# Patient Record
Sex: Male | Born: 1960 | Race: White | Hispanic: No | Marital: Married | Smoking: Former smoker
Health system: Southern US, Community
[De-identification: ages and names within clinical notes are randomized; demographics above are authoritative.]

## PROBLEM LIST (undated history)

## (undated) DIAGNOSIS — M1990 Unspecified osteoarthritis, unspecified site: Secondary | ICD-10-CM

## (undated) DIAGNOSIS — K219 Gastro-esophageal reflux disease without esophagitis: Secondary | ICD-10-CM

## (undated) DIAGNOSIS — C61 Malignant neoplasm of prostate: Secondary | ICD-10-CM

## (undated) DIAGNOSIS — I10 Essential (primary) hypertension: Secondary | ICD-10-CM

## (undated) HISTORY — PX: PROSTATE BIOPSY: SHX241

---

## 1998-04-10 ENCOUNTER — Inpatient Hospital Stay (HOSPITAL_COMMUNITY): Admission: EM | Admit: 1998-04-10 | Discharge: 1998-04-19 | Payer: Self-pay | Admitting: *Deleted

## 1998-04-10 ENCOUNTER — Encounter: Payer: Self-pay | Admitting: *Deleted

## 1998-04-10 ENCOUNTER — Encounter: Payer: Self-pay | Admitting: Specialist

## 1998-04-11 ENCOUNTER — Encounter: Payer: Self-pay | Admitting: Specialist

## 1998-04-12 ENCOUNTER — Encounter: Payer: Self-pay | Admitting: Specialist

## 1998-04-13 ENCOUNTER — Encounter: Payer: Self-pay | Admitting: Specialist

## 1998-04-18 ENCOUNTER — Encounter: Payer: Self-pay | Admitting: Specialist

## 1998-04-19 ENCOUNTER — Inpatient Hospital Stay (HOSPITAL_COMMUNITY)
Admission: RE | Admit: 1998-04-19 | Discharge: 1998-04-27 | Payer: Self-pay | Admitting: Physical Medicine & Rehabilitation

## 1998-04-25 ENCOUNTER — Encounter: Payer: Self-pay | Admitting: Physical Medicine & Rehabilitation

## 2004-04-20 ENCOUNTER — Emergency Department (HOSPITAL_COMMUNITY): Admission: EM | Admit: 2004-04-20 | Discharge: 2004-04-20 | Payer: Self-pay | Admitting: Family Medicine

## 2016-12-13 ENCOUNTER — Ambulatory Visit
Admission: RE | Admit: 2016-12-13 | Discharge: 2016-12-13 | Disposition: A | Discharge status: Home / Self Care | Payer: No Typology Code available for payment source | Source: Ambulatory Visit | Attending: Occupational Medicine | Admitting: Occupational Medicine

## 2016-12-13 ENCOUNTER — Other Ambulatory Visit: Payer: Self-pay | Admitting: Occupational Medicine

## 2016-12-13 DIAGNOSIS — Z021 Encounter for pre-employment examination: Secondary | ICD-10-CM

## 2017-04-01 DIAGNOSIS — I10 Essential (primary) hypertension: Secondary | ICD-10-CM | POA: Diagnosis not present

## 2017-05-10 DIAGNOSIS — I10 Essential (primary) hypertension: Secondary | ICD-10-CM | POA: Diagnosis not present

## 2017-10-10 DIAGNOSIS — Z23 Encounter for immunization: Secondary | ICD-10-CM | POA: Diagnosis not present

## 2017-10-21 DIAGNOSIS — M67372 Transient synovitis, left ankle and foot: Secondary | ICD-10-CM | POA: Diagnosis not present

## 2017-10-21 DIAGNOSIS — M19072 Primary osteoarthritis, left ankle and foot: Secondary | ICD-10-CM | POA: Diagnosis not present

## 2017-10-31 DIAGNOSIS — M67372 Transient synovitis, left ankle and foot: Secondary | ICD-10-CM | POA: Diagnosis not present

## 2017-11-12 DIAGNOSIS — I10 Essential (primary) hypertension: Secondary | ICD-10-CM | POA: Diagnosis not present

## 2019-01-30 DIAGNOSIS — I7090 Unspecified atherosclerosis: Secondary | ICD-10-CM

## 2019-01-30 NOTE — ED Notes (Signed)
cpr continued dr Emily Filbert at the bedside

## 2019-01-30 NOTE — ED Notes (Signed)
Dr Emily Filbert has ended the code time of death

## 2019-01-31 ENCOUNTER — Inpatient Hospital Stay
Admit: 2019-01-31 | Discharge: 2019-02-02 | Disposition: E | Payer: PRIVATE HEALTH INSURANCE | Attending: Emergency Medicine

## 2019-01-31 MED ORDER — EPINEPHRINE 0.1 MG/ML SYRINGE
0.1 mg/mL | INTRAMUSCULAR | Status: AC | PRN
Start: 2019-01-31 — End: 2019-01-30
  Administered 2019-01-31: 05:00:00 via INTRAVENOUS

## 2019-01-31 NOTE — ED Notes (Signed)
I just spoke with the patient's daughter named Darrien Belter.  The [facility] police Department had stop by moments ago at their home and notified them of the death of Allen Lee.  The family was told of the cardiac arrest and the attempts to resuscitate [name].  They understand the medical examiner has refused the case and they have been encouraged to call their local funeral home of choice later this morning who can arrange for the transfer the [place] back [place] .

## 2019-01-31 NOTE — ED Provider Notes (Addendum)
This 59 year old white male presents via ambulance with CPR in progress.  Apparently this patient screamed out and was then found unresponsive by his roommate who called EMS.  About 15 minutes later they arrived finding the patient in asystole.  The patient had an LMA placed and CPR was started.  He received 4 or 5 rounds of IV epinephrine.  He never had return of spontaneous circulation.  Upon arriving here in the emergency room about 25 minutes later the patient was still in asystole.  The patient has no prior records here at this hospital or in Care everywhere.  EMS did not get any medical history from the room a prior to bringing the patient to the emergency room.  No vomiting had been noted.           History reviewed. No pertinent past medical history.    No past surgical history on file.      History reviewed. No pertinent family history.    Social History     Socioeconomic History   ??? Marital status: Not on file     Spouse name: Not on file   ??? Number of children: Not on file   ??? Years of education: Not on file   ??? Highest education level: Not on file   Occupational History   ??? Not on file   Social Needs   ??? Financial resource strain: Not on file   ??? Food insecurity     Worry: Not on file     Inability: Not on file   ??? Transportation needs     Medical: Not on file     Non-medical: Not on file   Tobacco Use   ??? Smoking status: Not on file   Substance and Sexual Activity   ??? Alcohol use: Not on file   ??? Drug use: Not on file   ??? Sexual activity: Not on file   Lifestyle   ??? Physical activity     Days per week: Not on file     Minutes per session: Not on file   ??? Stress: Not on file   Relationships   ??? Social Wellsite geologist on phone: Not on file     Gets together: Not on file     Attends religious service: Not on file     Active member of club or organization: Not on file     Attends meetings of clubs or organizations: Not on file     Relationship status: Not on file   ??? Intimate partner violence      Fear of current or ex partner: Not on file     Emotionally abused: Not on file     Physically abused: Not on file     Forced sexual activity: Not on file   Other Topics Concern   ??? Not on file   Social History Narrative   ??? Not on file         ALLERGIES: Patient has no known allergies.    Review of Systems   Unable to perform ROS: Intubated       Vitals:    February 01, 2019 2345   BP: (!) 0/0   Pulse: (!) 0   Resp: (!) 0   SpO2: (!) 56%            Physical Exam  Nursing note reviewed.   Constitutional:       Appearance: He is obese.      Comments:  This patient was intubated with bilateral breath sounds noted.  He was connected to the [place] 2 the which was provided chest compressions.  The patient had cool extremities x4.  His pupils were both fixed and dilated.  No spontaneous respirations or palpable/auscultated heart sounds were noted.  His legs or thin without any edema/swelling.   HENT:      Head: Normocephalic.      Nose: No rhinorrhea.   Eyes:      Conjunctiva/sclera: Conjunctivae normal.      Comments: The pupils were fixed and dilated.   Cardiovascular:      Comments: No pulses were noted when CPR was paused.  Pulmonary:      Comments: The patient had clear breath sounds on the left and a few rales in the right side.  Abdominal:      Palpations: Abdomen is soft.   Musculoskeletal:      Right lower leg: No edema.      Left lower leg: No edema.   Skin:     General: Skin is dry.      Findings: No rash.      Comments: No rash was noted.  The skin was cool and pale.  A 5 cm wide old scar was noted to the infraumbilical to suprapubic area.          MDM  Number of Diagnoses or Management Options  Asystole ([place])  Atherosclerotic vascular disease  Obesity (BMI 30-39.9)  Sudden cardiac death [facility])   Diagnosis management comments: This 59 year old white male collapsed about 40 minutes prior to arrival to the emergency room and was noted to be in asystole about 15 minutes later upon EMS arrival.  The patient received IV epi and CPR prior to arrival.  He remained in asystole.  He had 5 minutes of CPR with another dose of epinephrine here in the emergency room with CPR without benefit.  The code was called at 11:50 p.m..  The case was discussed with the ENT specialist Johns no who has declined the case.  I will complete the [place] of death does the the certifying and pronouncing physician.         Procedures      NIH Stroke Scale                   ICD-10-CM ICD-9-CM    1. Atherosclerotic vascular disease  I70.90 440.9    2. Sudden cardiac death ([place])  I46.9 798.1    3. Obesity (BMI 30-39.9)  E66.9 278.00    4. Asystole (HCC)  I46.9 427.5

## 2019-01-31 NOTE — ED Triage Notes (Signed)
The patient was found unconscious/unresponsive in the basement, ems found him asystole, cpr was initiated, placed an lma, io and gave 5 epinepherines with conversion to pea, transported him here

## 2019-01-31 NOTE — ED Notes (Signed)
The patient was placed in a body bag, he has his personal belongings including wallet, cash, t-shirt, gym pants and underware

## 2019-01-31 NOTE — ED Notes (Signed)
Allen Lee ME on the phone with Dr. Emily Filbert

## 2019-02-02 DEATH — deceased

## 2020-02-18 ENCOUNTER — Encounter: Payer: Self-pay | Admitting: Radiation Oncology

## 2020-02-18 DIAGNOSIS — R030 Elevated blood-pressure reading, without diagnosis of hypertension: Secondary | ICD-10-CM | POA: Insufficient documentation

## 2020-02-18 DIAGNOSIS — S91319A Laceration without foreign body, unspecified foot, initial encounter: Secondary | ICD-10-CM | POA: Insufficient documentation

## 2020-02-18 NOTE — Progress Notes (Signed)
GU Location of Tumor / Histology: prostatic adenocarcinoma  If Prostate Cancer, Gleason Score is (3 + 4) and PSA is (3.29). Prostate volume: 33.12 grams.  Mike Graham presented with an elevated PSA and BB sized nodule on the right mid lateral side of his prostate.  Biopsies of prostate (if applicable) revealed:    Past/Anticipated interventions by urology, if any: prostate biopsy, referral to Dr. Tammi Klippel to discuss brachytherapy  Past/Anticipated interventions by medical oncology, if any: no  Weight changes, if any: yes, intentional weight loss of 40 lb x 1 year related to walking daily  Bowel/Bladder complaints, if any: IPSS 0. SHIM 25.Denies dysuria or hematuria. Denies urinary leakage or incontinence. Denies any bowel complaints.   Nausea/Vomiting, if any: no  Pain issues, if any:  Arthritic pain  SAFETY ISSUES:  Prior radiation? denies  Pacemaker/ICD? denies  Possible current pregnancy? no, male patient  Is the patient on methotrexate? no  Current Complaints / other details:  60 year old male. Married. Res[place] in [place]. Stopped smoking 1ppd in 1985. Patient's mother with hx of breast ca. Retired Airline pilot.

## 2020-02-19 ENCOUNTER — Ambulatory Visit
Admission: RE | Admit: 2020-02-19 | Discharge: 2020-02-19 | Disposition: A | Discharge status: Home / Self Care | Payer: 59 | Source: Ambulatory Visit | Attending: Radiation Oncology | Admitting: Radiation Oncology

## 2020-02-19 ENCOUNTER — Other Ambulatory Visit: Payer: Self-pay

## 2020-02-19 ENCOUNTER — Encounter: Payer: Self-pay | Admitting: Radiation Oncology

## 2020-02-19 VITALS — BP 132/78 | HR 73 | Temp 96.9°F | Resp 18 | Ht 67.5 in | Wt 218.0 lb

## 2020-02-19 DIAGNOSIS — Z87891 Personal history of nicotine dependence: Secondary | ICD-10-CM | POA: Diagnosis not present

## 2020-02-19 DIAGNOSIS — I10 Essential (primary) hypertension: Secondary | ICD-10-CM | POA: Insufficient documentation

## 2020-02-19 DIAGNOSIS — Z803 Family history of malignant neoplasm of breast: Secondary | ICD-10-CM | POA: Insufficient documentation

## 2020-02-19 DIAGNOSIS — M129 Arthropathy, unspecified: Secondary | ICD-10-CM | POA: Diagnosis not present

## 2020-02-19 DIAGNOSIS — C61 Malignant neoplasm of prostate: Secondary | ICD-10-CM

## 2020-02-19 DIAGNOSIS — E7800 Pure hypercholesterolemia, unspecified: Secondary | ICD-10-CM | POA: Insufficient documentation

## 2020-02-19 DIAGNOSIS — K219 Gastro-esophageal reflux disease without esophagitis: Secondary | ICD-10-CM | POA: Insufficient documentation

## 2020-02-19 DIAGNOSIS — Z808 Family history of malignant neoplasm of other organs or systems: Secondary | ICD-10-CM | POA: Diagnosis not present

## 2020-02-19 HISTORY — DX: Essential (primary) hypertension: I10

## 2020-02-19 HISTORY — DX: Malignant neoplasm of prostate: C61

## 2020-02-19 HISTORY — DX: Unspecified osteoarthritis, unspecified site: M19.90

## 2020-02-19 HISTORY — DX: Gastro-esophageal reflux disease without esophagitis: K21.9

## 2020-02-19 NOTE — Progress Notes (Signed)
Radiation Oncology         [phone]) [phone] ________________________________  Initial outpatient Consultation  Name: Linda Biehn MRN: [id number]  Date: 02/19/2020  DOB: Nov 29, 1960  HF:[id number], Provider Not In  Lucas Mallow, MD   REFERRING PHYSICIAN: Lucas Mallow, MD  DIAGNOSIS: 60 y.o. gentleman with Stage T2a adenocarcinoma of the prostate with Gleason score of 3+4, and PSA of 3.29.    ICD-10-CM   1. Malignant neoplasm of prostate ([place])  C61 Ambulatory referral to Social Work   Oncology History  Malignant neoplasm of prostate ([place])  02/03/2020 Cancer Staging   Staging form: Prostate, AJCC 8th Edition - Clinical stage from 02/03/2020: Stage IIB (cT2a, cN0, cM0, PSA: 3.3, Grade Group: 2) - Signed by Freeman Caldron, PA-C on 02/22/2020 Histopathologic type: Adenocarcinoma, NOS Stage prefix: Initial diagnosis Prostate specific antigen (PSA) range: Less than 10 Gleason primary pattern: 3 Gleason secondary pattern: 4 Gleason score: 7 Histologic grading system: 5 grade system Number of biopsy cores examined: 12 Number of biopsy cores positive: 1 Location of positive needle core biopsies: One side   02/22/2020 Initial Diagnosis   Malignant neoplasm of prostate ([place])      HISTORY OF PRESENT ILLNESS: Oluwanifemi Susman is a 60 y.o. male with a diagnosis of prostate cancer. He was noted to have a rising PSA at 3.29 in 12/2019 by his primary care physician, Dr. Gracelyn Nurse.  Previous PSA was 1.33 in May 2020 and 2.26 in June 2021.  Accordingly, he was referred for evaluation in urology by Dr. Gloriann Loan on 01/05/2020,  digital rectal examination was performed at that time revealing a BB-sized nodule on the right mid far lateral side.  The patient proceeded to transrectal ultrasound with 12 biopsies of the prostate on 02/03/2020.  The prostate volume measured 33.12 cc. Out of 12 core biopsies, 1 was positive.  The maximum Gleason score was 3+4, and this was seen in 5% of the right apex lateral (with  PNI).  The patient reviewed the biopsy results with his urologist and he has kindly been referred today for discussion of potential radiation treatment options.   PREVIOUS RADIATION THERAPY: No  PAST MEDICAL HISTORY:  Past Medical History:  Diagnosis Date  . Arthritis   . GERD (gastroesophageal reflux disease)   . Hypertension   . Prostate cancer ([place])       PAST SURGICAL HISTORY: Past Surgical History:  Procedure Laterality Date  . PROSTATE BIOPSY      FAMILY HISTORY:  Family History  Problem Relation Age of Onset  . Breast cancer Mother   . Mesothelioma Father   . Prostate cancer Neg Hx   . Colon cancer Neg Hx   . Pancreatic cancer Neg Hx     SOCIAL HISTORY:  Social History   Socioeconomic History  . Marital status: Married    Spouse name: Not on file  . Number of children: Not on file  . Years of education: Not on file  . Highest education level: Not on file  Occupational History  . Occupation: Airline pilot    Comment: retired  Tobacco Use  . Smoking status: Former Smoker    Packs/day: 1.00    Years: 6.00    Pack years: 6.00    Types: Cigarettes    Quit date: 01/02/1983    Years since quitting: 37.1  . Smokeless tobacco: Never Used  Vaping Use  . Vaping Use: Never used  Substance and Sexual Activity  . Alcohol use: Not Currently  . Drug  use: Never  . Sexual activity: Yes  Other Topics Concern  . Not on file  Social History Narrative  . Not on file   Social Determinants of Health   Financial Resource Strain: Not on file  Food Insecurity: Not on file  Transportation Needs: Not on file  Physical Activity: Not on file  Stress: Not on file  Social Connections: Not on file  Intimate Partner Violence: Not on file    ALLERGIES: Patient has no known allergies.  MEDICATIONS:  Current Outpatient Medications  Medication Sig Dispense Refill  . lisinopril-hydrochlorothiazide (ZESTORETIC) 10-12.5 MG tablet Take 1 tablet by mouth daily.     No current  facility-administered medications for this encounter.    REVIEW OF SYSTEMS:  On review of systems, the patient reports that he is doing well overall. He denies any chest pain, shortness of breath, cough, fevers, chills, night sweats, unintended weight changes. He denies any bowel disturbances, and denies abdominal pain, nausea or vomiting. He denies any new musculoskeletal or joint aches or pains. His IPSS was 0, indicating no urinary symptoms. His SHIM was 25, indicating he does not have erectile dysfunction. A complete review of systems is obtained and is otherwise negative.    PHYSICAL EXAM:  Wt Readings from Last 3 Encounters:  02/19/20 218 lb (98.9 kg)   Temp Readings from Last 3 Encounters:  02/19/20 (!) 96.9 F (36.1 C) (Temporal)   BP Readings from Last 3 Encounters:  02/19/20 132/78   Pulse Readings from Last 3 Encounters:  02/19/20 73   Pain Assessment Pain Score: 0-No pain/10  In general this is a well appearing Caucasian male in no acute distress. He's alert and oriented x4 and appropriate throughout the examination. Cardiopulmonary assessment is negative for acute distress, and he exhibits normal effort.     KPS = 100  100 - Normal; no complaints; no evidence of disease. 90   - Able to carry on normal activity; minor signs or symptoms of disease. 80   - Normal activity with effort; some signs or symptoms of disease. 29   - Cares for self; unable to carry on normal activity or to do active work. 60   - Requires occasional assistance, but is able to care for most of his personal needs. 50   - Requires considerable assistance and frequent medical care. 24   - Disabled; requires special care and assistance. 36   - Severely disabled; hospital admission is indicated although death not imminent. 37   - Very sick; hospital admission necessary; active supportive treatment necessary. 10   - Moribund; fatal processes progressing rapidly. 0     - Dead  Karnofsky DA, Abelmann  WH, Craver LS and Burchenal JH [phone]) The use of the nitrogen mustards in the palliative treatment of carcinoma: with particular reference to bronchogenic carcinoma Cancer 1 634-56  LABORATORY DATA:  No results found for: WBC, HGB, HCT, MCV, PLT No results found for: NA, K, CL, CO2 No results found for: ALT, AST, GGT, ALKPHOS, BILITOT   RADIOGRAPHY: No results found.    IMPRESSION/PLAN: 1. 60 y.o. gentleman with Stage T2a adenocarcinoma of the prostate with Gleason Score of 3+4, and PSA of 3.29. We discussed the patient's workup and outlined the nature of prostate cancer in this setting. The patient's T stage, Gleason's score, and PSA put him into the favorable intermediate risk group. Accordingly, he is eligible for a variety of potential treatment options including brachytherapy, 5.5 weeks of external radiation, or prostatectomy.  We also discussed the option of active surveillance, weight given his very low volume disease.  We discussed the available radiation techniques, and focused on the details and logistics of delivery. We discussed and outlined the risks, benefits, short and long-term effects associated with radiotherapy and compared and contrasted these with prostatectomy. We discussed the role of SpaceOAR gel in reducing the rectal toxicity associated with radiotherapy.  He appears to have a good understanding of his disease and our treatment recommendations which are of curative intent.  He was encouraged to ask questions that were answered to his stated satisfaction.  At the conclusion of our conversation, the patient remains undecided regarding his final treatment decision but appears to be leaning towards proceeding with active surveillance with a prostate MRI and a repeat PSA in approximately 6 months.  He would also like to obtain a second opinion with one of Dr. Purvis Sheffield colleagues at [facility] urology.  We will share this information with Dr. Gloriann Loan and look forward to continuing to follow  his progress.  He has our contact information should he change his mind and prefer to proceed with radiotherapy.   Nicholos Johns, PA-C    Tyler Pita, MD  [facility] Direct Dial: [phone]  Fax: [phone] [email or web address]  Skype  LinkedIn   This document serves as a record of services personally performed by Tyler Pita, MD and Freeman Caldron, PA-C. It was created on their behalf by Wilburn Mylar, a trained medical scribe. The creation of this record is based on the scribe's personal observations and the provider's statements to them. This document has been checked and approved by the attending provider.

## 2020-02-22 ENCOUNTER — Telehealth: Payer: Self-pay | Admitting: *Deleted

## 2020-02-22 ENCOUNTER — Other Ambulatory Visit: Payer: Self-pay | Admitting: Urology

## 2020-02-22 DIAGNOSIS — C61 Malignant neoplasm of prostate: Secondary | ICD-10-CM | POA: Insufficient documentation

## 2020-02-22 NOTE — Telephone Encounter (Signed)
Called patient to inform of [place] visit with Dr. Louis Meckel on 03-01-20 - arrival time - 9:15 am, lvm for a return call

## 2020-02-23 ENCOUNTER — Encounter: Payer: Self-pay | Admitting: Licensed Clinical Social Worker

## 2020-02-23 NOTE — Progress Notes (Signed)
[place] Psychosocial Distress Screening Clinical Social Work  Clinical Social Work was referred by distress screening protocol.  The patient scored a 7 on the Psychosocial Distress Thermometer which indicates moderate distress. Clinical Social Worker attempted to contact patient by phone to assess for distress and other psychosocial needs.  No answer. Left VM with brief description of support services and direct contact information.  ONCBCN DISTRESS SCREENING 02/19/2020  Screening Type Initial Screening  Distress experienced in past week (1-10) 7  Practical problem type Transportation  Emotional problem type Adjusting to illness  Information Concerns Type Lack of info about diagnosis  Physician notified of physical symptoms Yes  Referral to clinical psychology No  Referral to clinical social work Yes  Referral to dietition No  Referral to financial advocate No  Referral to support programs No  Referral to palliative care No    Clinical Social Worker follow up needed: No. May be re-referred if pt opts for treatment at [facility]  If yes, follow up plan:  Mike Graham Mike Jaid Quirion, LCSW

## 2020-07-05 ENCOUNTER — Other Ambulatory Visit: Payer: Self-pay | Admitting: Urology

## 2020-07-05 DIAGNOSIS — C61 Malignant neoplasm of prostate: Secondary | ICD-10-CM

## 2020-08-16 ENCOUNTER — Ambulatory Visit
Admission: RE | Admit: 2020-08-16 | Discharge: 2020-08-16 | Disposition: A | Discharge status: Home / Self Care | Payer: 59 | Source: Ambulatory Visit | Attending: Urology | Admitting: Urology

## 2020-08-16 ENCOUNTER — Other Ambulatory Visit: Payer: Self-pay

## 2020-08-16 DIAGNOSIS — C61 Malignant neoplasm of prostate: Secondary | ICD-10-CM

## 2020-08-16 MED ORDER — GADOBENATE DIMEGLUMINE 529 MG/ML IV SOLN
19.0000 mL | Freq: Once | INTRAVENOUS | Status: AC | PRN
  Administered 2020-08-16: 19 mL via INTRAVENOUS

## 2022-11-12 NOTE — H&P (Signed)
Surgical History & Physical  Patient Name: Mike Graham  DOB: 1960-07-17  Surgery: Cataract extraction with intraocular lens implant phacoemulsification; Right Eye Surgeon: Mike Pierce MD Surgery Date: 11/16/2022 Pre-Op Date: 11/01/2022  HPI: A 24 Yr. old male patient 1. The patient is here for Cataract evaluation, Ref: Dr. Charise Graham. Pt. complains of difficulty when driving at night. The right eye is affected. The episode is constant. The condition's severity has decreased. Symptoms occur when the patient is driving.. Pt. is a bus driver for work. The complaint is associated with blurry vision. This is negatively affecting the patient's quality of life and the patient is unable to function adequately in life with the current level of vision. Pt. states he is ready to proceed with surgery. HPI Completed by Dr. Fabio Graham  Medical History: Cataracts High Blood Pressure  Review of Systems Negative Allergic/Immunologic Hypertension Cardiovascular Negative Constitutional Negative Ear, Nose, Mouth & Throat Negative Endocrine Cataract Eyes Negative Gastrointestinal Negative Genitourinary Negative Hemotologic/Lymphatic Negative Integumentary Negative Musculoskeletal Negative Neurological Negative Psychiatry Negative Respiratory  Social Never smoked   Medication None  Sx/Procedures Wrist Sx, Pelvic Reconstruct  Drug Allergies  lisinopril   History & Physical: Heent: cataract  NECK: supple without bruits LUNGS: lungs clear to auscultation CV: regular rate and rhythm Abdomen: soft and non-tender  Impression & Plan: Assessment: 1.  COMBINED FORMS AGE RELATED CATARACT; Both Eyes (H25.813) 2.  BLEPHARITIS; Right Upper Lid, Right Lower Lid, Left Upper Lid, Left Lower Lid (H01.001, H01.002,H01.004,H01.005) 3.  DERMATOCHALASIS, no surgery; Right Upper Lid, Left Upper Lid (H02.831, H02.834) 4.  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  Cataract accounts for the patient's decreased vision. This visual impairment is not correctable with a tolerable change in glasses or contact lenses. Cataract surgery with an implantation of a new lens should significantly improve the visual and functional status of the patient. Discussed all risks, benefits, alternatives, and potential complications. Discussed the procedures and recovery. Patient desires to have surgery. A-scan ordered and performed today for intra-ocular lens calculations. The surgery will be performed in order to improve vision for driving, reading, and for eye examinations. Recommend phacoemulsification with intra-ocular lens. Recommend Dextenza for post-operative pain and inflammation. Right Eye worse - first. Dilates well - shugarcaine by protocol. Discussed Vivity IOL - patient declines  2.  Blepharitis is present - recommend regular lid cleaning.  3.  Asymptomatic, recommend observation for now. Findings, prognosis and treatment options reviewed.  4.  Observe; Artificial tears as needed for irritation.  5.  Stable.

## 2022-11-13 ENCOUNTER — Encounter (HOSPITAL_COMMUNITY)
Admission: RE | Admit: 2022-11-13 | Discharge: 2022-11-13 | Disposition: A | Discharge status: Home / Self Care | Payer: 59 | Source: Ambulatory Visit | Attending: Ophthalmology | Admitting: Ophthalmology

## 2022-11-13 ENCOUNTER — Other Ambulatory Visit: Payer: Self-pay

## 2022-11-13 ENCOUNTER — Encounter (HOSPITAL_COMMUNITY): Payer: Self-pay

## 2022-11-13 NOTE — Pre-Procedure Instructions (Signed)
Attempted pre-op phone call. Left VM for him to call us back. 

## 2022-11-16 ENCOUNTER — Ambulatory Visit (HOSPITAL_COMMUNITY): Payer: 59 | Admitting: Anesthesiology

## 2022-11-16 ENCOUNTER — Ambulatory Visit (HOSPITAL_COMMUNITY)
Admission: RE | Admit: 2022-11-16 | Discharge: 2022-11-16 | Disposition: A | Discharge status: Home / Self Care | Payer: 59 | Attending: Ophthalmology | Admitting: Ophthalmology

## 2022-11-16 ENCOUNTER — Encounter (HOSPITAL_COMMUNITY)
Admission: RE | Disposition: A | Discharge status: Home / Self Care | Payer: Self-pay | Source: Home / Self Care | Attending: Ophthalmology

## 2022-11-16 ENCOUNTER — Encounter (HOSPITAL_COMMUNITY): Payer: Self-pay | Admitting: Ophthalmology

## 2022-11-16 DIAGNOSIS — H25811 Combined forms of age-related cataract, right eye: Secondary | ICD-10-CM | POA: Insufficient documentation

## 2022-11-16 DIAGNOSIS — H01002 Unspecified blepharitis right lower eyelid: Secondary | ICD-10-CM | POA: Insufficient documentation

## 2022-11-16 DIAGNOSIS — I10 Essential (primary) hypertension: Secondary | ICD-10-CM | POA: Diagnosis not present

## 2022-11-16 DIAGNOSIS — H02834 Dermatochalasis of left upper eyelid: Secondary | ICD-10-CM | POA: Insufficient documentation

## 2022-11-16 DIAGNOSIS — H02831 Dermatochalasis of right upper eyelid: Secondary | ICD-10-CM | POA: Insufficient documentation

## 2022-11-16 DIAGNOSIS — H11153 Pinguecula, bilateral: Secondary | ICD-10-CM | POA: Diagnosis not present

## 2022-11-16 DIAGNOSIS — H01001 Unspecified blepharitis right upper eyelid: Secondary | ICD-10-CM | POA: Diagnosis not present

## 2022-11-16 DIAGNOSIS — Z87891 Personal history of nicotine dependence: Secondary | ICD-10-CM | POA: Diagnosis not present

## 2022-11-16 DIAGNOSIS — H01004 Unspecified blepharitis left upper eyelid: Secondary | ICD-10-CM | POA: Diagnosis not present

## 2022-11-16 DIAGNOSIS — H1712 Central corneal opacity, left eye: Secondary | ICD-10-CM | POA: Diagnosis not present

## 2022-11-16 DIAGNOSIS — H01005 Unspecified blepharitis left lower eyelid: Secondary | ICD-10-CM | POA: Insufficient documentation

## 2022-11-16 HISTORY — PX: CATARACT EXTRACTION W/PHACO: SHX586

## 2022-11-16 SURGERY — PHACOEMULSIFICATION, CATARACT, WITH IOL INSERTION
Anesthesia: Monitor Anesthesia Care | Site: Eye | Laterality: Right

## 2022-11-16 MED ORDER — PHENYLEPHRINE HCL 2.5 % OP SOLN
1.0000 [drp] | OPHTHALMIC | Status: AC | PRN
  Administered 2022-11-16 (×3): 1 [drp] via OPHTHALMIC

## 2022-11-16 MED ORDER — SODIUM HYALURONATE 10 MG/ML IO SOLUTION
PREFILLED_SYRINGE | INTRAOCULAR | Status: DC | PRN
  Administered 2022-11-16: .85 mL via INTRAOCULAR

## 2022-11-16 MED ORDER — SODIUM CHLORIDE 0.9% FLUSH
INTRAVENOUS | Status: DC | PRN
  Administered 2022-11-16: 5 mL via INTRAVENOUS

## 2022-11-16 MED ORDER — MOXIFLOXACIN HCL 5 MG/ML IO SOLN
INTRAOCULAR | Status: DC | PRN
  Administered 2022-11-16: .2 mL via INTRACAMERAL

## 2022-11-16 MED ORDER — BSS IO SOLN
INTRAOCULAR | Status: DC | PRN
  Administered 2022-11-16: 15 mL via INTRAOCULAR

## 2022-11-16 MED ORDER — STERILE WATER FOR IRRIGATION IR SOLN
Status: DC | PRN
  Administered 2022-11-16: 250 mL

## 2022-11-16 MED ORDER — LIDOCAINE HCL (PF) 1 % IJ SOLN
INTRAOCULAR | Status: DC | PRN
  Administered 2022-11-16: 1 mL via OPHTHALMIC

## 2022-11-16 MED ORDER — MIDAZOLAM HCL 5 MG/5ML IJ SOLN
INTRAMUSCULAR | Status: DC | PRN
  Administered 2022-11-16: 2 mg via INTRAVENOUS

## 2022-11-16 MED ORDER — POVIDONE-IODINE 5 % OP SOLN
OPHTHALMIC | Status: DC | PRN
  Administered 2022-11-16: 1 via OPHTHALMIC

## 2022-11-16 MED ORDER — LIDOCAINE HCL 3.5 % OP GEL
1.0000 | Freq: Once | OPHTHALMIC | Status: AC
  Administered 2022-11-16: 1 via OPHTHALMIC

## 2022-11-16 MED ORDER — SODIUM HYALURONATE 23MG/ML IO SOSY
PREFILLED_SYRINGE | INTRAOCULAR | Status: DC | PRN
  Administered 2022-11-16: .6 mL via INTRAOCULAR

## 2022-11-16 MED ORDER — TROPICAMIDE 1 % OP SOLN
1.0000 [drp] | OPHTHALMIC | Status: AC | PRN
  Administered 2022-11-16 (×3): 1 [drp] via OPHTHALMIC

## 2022-11-16 MED ORDER — TETRACAINE HCL 0.5 % OP SOLN
1.0000 [drp] | OPHTHALMIC | Status: AC | PRN
  Administered 2022-11-16 (×3): 1 [drp] via OPHTHALMIC

## 2022-11-16 MED ORDER — EPINEPHRINE PF 1 MG/ML IJ SOLN
INTRAOCULAR | Status: DC | PRN
  Administered 2022-11-16: 500 mL

## 2022-11-16 SURGICAL SUPPLY — 14 items
CATARACT SUITE SIGHTPATH (MISCELLANEOUS) ×1
CLOTH BEACON ORANGE TIMEOUT ST (SAFETY) ×1 IMPLANT
EYE SHIELD UNIVERSAL CLEAR (GAUZE/BANDAGES/DRESSINGS) IMPLANT
FEE CATARACT SUITE SIGHTPATH (MISCELLANEOUS) ×1 IMPLANT
GLOVE BIOGEL PI IND STRL 7.0 (GLOVE) ×2 IMPLANT
LENS IOL TECNIS EYHANCE 20.5 (Intraocular Lens) IMPLANT
NDL HYPO 18GX1.5 BLUNT FILL (NEEDLE) ×1 IMPLANT
NEEDLE HYPO 18GX1.5 BLUNT FILL (NEEDLE) ×1
PAD ARMBOARD 7.5X6 YLW CONV (MISCELLANEOUS) ×1 IMPLANT
POSITIONER HEAD 8X9X4 ADT (SOFTGOODS) ×1 IMPLANT
RING MALYGIN 7.0 (MISCELLANEOUS) IMPLANT
SYR TB 1ML LL NO SAFETY (SYRINGE) ×1 IMPLANT
TAPE SURG TRANSPORE 1 IN (GAUZE/BANDAGES/DRESSINGS) IMPLANT
WATER STERILE IRR 250ML POUR (IV SOLUTION) ×1 IMPLANT

## 2022-11-16 NOTE — Anesthesia Postprocedure Evaluation (Signed)
Anesthesia Post Note  Patient: Jeiden Hoesing  Procedure(s) Performed: CATARACT EXTRACTION PHACO AND INTRAOCULAR LENS PLACEMENT (IOC) (Right: Eye)  Patient location during evaluation: Short Stay Anesthesia Type: MAC Level of consciousness: awake and alert Pain management: pain level controlled Vital Signs Assessment: post-procedure vital signs reviewed and stable Respiratory status: spontaneous breathing Cardiovascular status: blood pressure returned to baseline and stable Postop Assessment: no apparent nausea or vomiting Anesthetic complications: no   No notable events documented.   Last Vitals:  Vitals:   11/16/22 1030 11/16/22 1056  BP:  133/84  Pulse: 60 70  Resp: 14 17  Temp:  36.6 C  SpO2: 97% 97%    Last Pain:  Vitals:   11/16/22 1056  TempSrc: Oral  PainSc: 0-No pain                 Jameria Bradway

## 2022-11-16 NOTE — Anesthesia Preprocedure Evaluation (Signed)
Anesthesia Evaluation  Patient identified by MRN, date of birth, ID band Patient awake    Reviewed: Allergy & Precautions, H&P , NPO status , Patient's Chart, lab work & pertinent test results, reviewed documented beta blocker date and time   Airway Mallampati: II  TM Distance: >3 FB Neck ROM: full    Dental no notable dental hx. (+) Dental Advisory Given, Teeth Intact   Pulmonary neg pulmonary ROS, former smoker   Pulmonary exam normal breath sounds clear to auscultation       Cardiovascular Exercise Tolerance: Good hypertension, negative cardio ROS Normal cardiovascular exam Rhythm:regular Rate:Normal     Neuro/Psych negative neurological ROS  negative psych ROS   GI/Hepatic negative GI ROS, Neg liver ROS,GERD  ,,  Endo/Other  negative endocrine ROS    Renal/GU negative Renal ROS  negative genitourinary   Musculoskeletal  (+) Arthritis , Osteoarthritis,    Abdominal   Peds  Hematology negative hematology ROS (+)   Anesthesia Other Findings Prostate Cancer  Reproductive/Obstetrics negative OB ROS                             Anesthesia Physical Anesthesia Plan  ASA: 3  Anesthesia Plan: MAC   Post-op Pain Management: Minimal or no pain anticipated   Induction:   PONV Risk Score and Plan:   Airway Management Planned: Nasal Cannula and Natural Airway  Additional Equipment: None  Intra-op Plan:   Post-operative Plan:   Informed Consent: I have reviewed the patients History and Physical, chart, labs and discussed the procedure including the risks, benefits and alternatives for the proposed anesthesia with the patient or authorized representative who has indicated his/her understanding and acceptance.     Dental Advisory Given  Plan Discussed with: CRNA  Anesthesia Plan Comments:         Anesthesia Quick Evaluation

## 2022-11-16 NOTE — Discharge Instructions (Signed)
Please discharge patient when stable, will follow up today with Dr. Carnella Fryman at the [facility] office immediately following discharge.  Leave shield in place until visit.  All paperwork with discharge instructions will be given at the office.  [facility] Address:  [address]  [place], [place] [address]  

## 2022-11-16 NOTE — Interval H&P Note (Signed)
History and Physical Interval Note:  11/16/2022 10:24 AM  [name]  has presented today for surgery, with the diagnosis of combined forms age related cataract, right eye.  The various methods of treatment have been discussed with the patient and family. After consideration of risks, benefits and other options for treatment, the patient has consented to  Procedure(s) with comments: CATARACT EXTRACTION PHACO AND INTRAOCULAR LENS PLACEMENT (IOC) (Right) - CDE: as a surgical intervention.  The patient's history has been reviewed, patient examined, no change in status, stable for surgery.  I have reviewed the patient's chart and labs.  Questions were answered to the patient's satisfaction.     Mike Graham

## 2022-11-16 NOTE — Op Note (Signed)
Date of procedure: 11/16/22  Pre-operative diagnosis:  Visually significant combined form age-related cataract, Right Eye (H25.811)  Post-operative diagnosis:  Visually significant combined form age-related cataract, Right Eye (H25.811)  Procedure: Removal of cataract via phacoemulsification and insertion of intra-ocular lens Laural Benes and [place] DIB00 +20.5D into the capsular bag of the Right Eye  Attending surgeon: Rudy Jew. Ludella Pranger, MD, MA  Anesthesia: MAC, Topical Akten  Complications: None  Estimated Blood Loss: <59mL (minimal)  Specimens: None  Implants: As above  Indications:  Visually significant age-related cataract, Right Eye  Procedure:  The patient was seen and identified in the pre-operative area. The operative eye was identified and dilated.  The operative eye was marked.  Topical anesthesia was administered to the operative eye.     The patient was then to the operative suite and placed in the supine position.  A timeout was performed confirming the patient, procedure to be performed, and all other relevant information.   The patient's face was prepped and draped in the usual fashion for intra-ocular surgery.  A lid speculum was placed into the operative eye and the surgical microscope moved into place and focused.  A superotemporal paracentesis was created using a 20 gauge paracentesis blade.  Shugarcaine was injected into the anterior chamber.  Viscoelastic was injected into the anterior chamber.  A temporal clear-corneal main wound incision was created using a 2.52mm microkeratome.  A continuous curvilinear capsulorrhexis was initiated using an irrigating cystitome and completed using capsulorrhexis forceps.  Hydrodissection and hydrodeliniation were performed.  Viscoelastic was injected into the anterior chamber.  A phacoemulsification handpiece and a chopper as a second instrument were used to remove the nucleus and epinucleus. The irrigation/aspiration handpiece was used to  remove any remaining cortical material.   The capsular bag was reinflated with viscoelastic, checked, and found to be intact.  The intraocular lens was inserted into the capsular bag.  The irrigation/aspiration handpiece was used to remove any remaining viscoelastic.  The clear corneal wound and paracentesis wounds were then hydrated and checked with Weck-Cels to be watertight. 0.22mL of Moxfloxacin was injected into the anterior chamber. The lid-speculum was removed.  The drape was removed.  The patient's face was cleaned with a wet and dry 4x4. A clear shield was taped over the eye. The patient was taken to the post-operative care unit in good condition, having tolerated the procedure well.  Post-Op Instructions: The patient will follow up at [facility] for a same day post-operative evaluation and will receive all other orders and instructions.

## 2022-11-16 NOTE — Transfer of Care (Signed)
Immediate Anesthesia Transfer of Care Note  Patient: Mike Graham  Procedure(s) Performed: CATARACT EXTRACTION PHACO AND INTRAOCULAR LENS PLACEMENT (IOC) (Right: Eye)  Patient Location: Short Stay  Anesthesia Type:MAC  Level of Consciousness: awake  Airway & Oxygen Therapy: Patient Spontanous Breathing  Post-op Assessment: Report given to RN  Post vital signs: Reviewed  Last Vitals:  Vitals Value Taken Time  BP    Temp    Pulse    Resp    SpO2      Last Pain:  Vitals:   11/16/22 0955  PainSc: 0-No pain         Complications: No notable events documented.

## 2022-11-20 ENCOUNTER — Encounter (HOSPITAL_COMMUNITY): Payer: Self-pay | Admitting: Ophthalmology

## 2022-12-19 ENCOUNTER — Encounter (HOSPITAL_COMMUNITY)
Admission: RE | Admit: 2022-12-19 | Discharge: 2022-12-19 | Disposition: A | Discharge status: Home / Self Care | Payer: 59 | Source: Ambulatory Visit | Attending: Ophthalmology | Admitting: Ophthalmology

## 2022-12-19 NOTE — H&P (Addendum)
Surgical History & Physical  Patient Name: Mike Graham  DOB: 1960-12-02  Surgery: Cataract extraction with intraocular lens implant phacoemulsification; Left Eye Surgeon: [name] MD Surgery Date: 12/21/2022 Pre-Op Date: 11/19/2022  HPI: A 50 Yr. old male patient 1. The patient is returning for a 3 day post op follow-up of the right eye and pre op OS. Since the last visit, the affected area is stable. The patient's vision is improved. Patient is following medication instructions. He stated he saw a flash of light in the od eye. The patient is having trouble reading captions on the tv on the os eye. He is having cataract sx on 12/20 on OS. This is negatively affecting the patient's quality of life and the patient is unable to function adequately in life with the current level of vision. HPI was performed by [name] .  Medical History: Cataracts High Blood Pressure  Review of Systems  Negative Allergic/Immunologic Hypertension Cardiovascular Negative Constitutional Negative Ear, Nose, Mouth & Throat Negative Endocrine Negative Eyes Negative Gastrointestinal Negative Genitourinary Negative Hemotologic/Lymphatic Negative Integumentary Negative Musculoskeletal Negative Neurological Negative Psychiatry Negative Respiratory  Social Never smoked   Medication Prednisolone-moxiflox-bromfenac  Sx/Procedures Phaco c IOL OD,  Wrist Sx, Pelvic Reconstruct  Drug Allergies  lisinopril   History & Physical: Heent: cataract NECK: supple without bruits LUNGS: lungs clear to auscultation CV: regular rate and rhythm Abdomen: soft and non-tender  Impression & Plan: Assessment: 1.  CATARACT EXTRACTION STATUS; Right Eye (Z98.41) 2.  COMBINED FORMS AGE RELATED CATARACT; Left Eye (H25.812) 3.  NUCLEAR SCLEROSIS AGE RELATED; Left Eye (H25.12)  Plan: 1.  3 days after cataract surgery. Doing well with improved vision and normal eye pressure. Call with any problems or  concerns. Continue Pred-Moxi-Brom 3x/day for 4 more days and then 2x/day for 3 more weeks.  2.  Cataract accounts for the patient's decreased vision. This visual impairment is not correctable with a tolerable change in glasses or contact lenses. Cataract surgery with an implantation of a new lens should significantly improve the visual and functional status of the patient. Discussed all risks, benefits, alternatives, and potential complications. Discussed the procedures and recovery. Patient desires to have surgery. A-scan ordered and performed today for intra-ocular lens calculations. The surgery will be performed in order to improve vision for driving, reading, and for eye examinations. Recommend phacoemulsification with intra-ocular lens. Recommend Dextenza for post-operative pain and inflammation. Left Eye. Surgery required to correct imbalance of vision. Dilates well - shugarcaine by protocol.  3. See above

## 2022-12-21 ENCOUNTER — Encounter (HOSPITAL_COMMUNITY): Payer: Self-pay | Admitting: Ophthalmology

## 2022-12-21 ENCOUNTER — Ambulatory Visit (HOSPITAL_COMMUNITY)
Admission: RE | Admit: 2022-12-21 | Discharge: 2022-12-21 | Disposition: A | Discharge status: Home / Self Care | Payer: 59 | Attending: Ophthalmology | Admitting: Ophthalmology

## 2022-12-21 ENCOUNTER — Encounter (HOSPITAL_COMMUNITY)
Admission: RE | Disposition: A | Discharge status: Home / Self Care | Payer: Self-pay | Source: Home / Self Care | Attending: Ophthalmology

## 2022-12-21 ENCOUNTER — Ambulatory Visit (HOSPITAL_COMMUNITY): Payer: 59 | Admitting: Certified Registered"

## 2022-12-21 DIAGNOSIS — H25812 Combined forms of age-related cataract, left eye: Secondary | ICD-10-CM | POA: Diagnosis present

## 2022-12-21 DIAGNOSIS — Z9841 Cataract extraction status, right eye: Secondary | ICD-10-CM | POA: Diagnosis not present

## 2022-12-21 DIAGNOSIS — I10 Essential (primary) hypertension: Secondary | ICD-10-CM | POA: Insufficient documentation

## 2022-12-21 HISTORY — PX: CATARACT EXTRACTION W/PHACO: SHX586

## 2022-12-21 SURGERY — PHACOEMULSIFICATION, CATARACT, WITH IOL INSERTION
Anesthesia: Monitor Anesthesia Care | Site: Eye | Laterality: Left

## 2022-12-21 MED ORDER — LIDOCAINE HCL (PF) 1 % IJ SOLN
INTRAOCULAR | Status: DC | PRN
  Administered 2022-12-21: 1 mL via OPHTHALMIC

## 2022-12-21 MED ORDER — LIDOCAINE HCL 3.5 % OP GEL
1.0000 | Freq: Once | OPHTHALMIC | Status: AC
  Administered 2022-12-21: 1 via OPHTHALMIC

## 2022-12-21 MED ORDER — MOXIFLOXACIN HCL 5 MG/ML IO SOLN
INTRAOCULAR | Status: DC | PRN
  Administered 2022-12-21: .3 mL via INTRACAMERAL

## 2022-12-21 MED ORDER — LACTATED RINGERS IV SOLN: INTRAVENOUS | Status: DC

## 2022-12-21 MED ORDER — POVIDONE-IODINE 5 % OP SOLN
OPHTHALMIC | Status: DC | PRN
  Administered 2022-12-21: 1 via OPHTHALMIC

## 2022-12-21 MED ORDER — SODIUM CHLORIDE 0.9% FLUSH
INTRAVENOUS | Status: DC | PRN
  Administered 2022-12-21: 6 mL via INTRAVENOUS

## 2022-12-21 MED ORDER — BSS IO SOLN
INTRAOCULAR | Status: DC | PRN
  Administered 2022-12-21: 15 mL via INTRAOCULAR

## 2022-12-21 MED ORDER — EPINEPHRINE PF 1 MG/ML IJ SOLN
INTRAOCULAR | Status: DC | PRN
  Administered 2022-12-21: 500 mL

## 2022-12-21 MED ORDER — TROPICAMIDE 1 % OP SOLN
1.0000 [drp] | OPHTHALMIC | Status: AC | PRN
Start: 2022-12-21 — End: 2022-12-21
  Administered 2022-12-21 (×3): 1 [drp] via OPHTHALMIC

## 2022-12-21 MED ORDER — STERILE WATER FOR IRRIGATION IR SOLN
Status: DC | PRN
  Administered 2022-12-21: 1

## 2022-12-21 MED ORDER — MIDAZOLAM HCL 2 MG/2ML IJ SOLN
INTRAMUSCULAR | Status: AC
  Filled 2022-12-21: qty 2

## 2022-12-21 MED ORDER — MIDAZOLAM HCL 2 MG/2ML IJ SOLN
INTRAMUSCULAR | Status: DC | PRN
  Administered 2022-12-21: 1 mg via INTRAVENOUS

## 2022-12-21 MED ORDER — SODIUM HYALURONATE 23MG/ML IO SOSY
PREFILLED_SYRINGE | INTRAOCULAR | Status: DC | PRN
  Administered 2022-12-21: .6 mL via INTRAOCULAR

## 2022-12-21 MED ORDER — TETRACAINE HCL 0.5 % OP SOLN
1.0000 [drp] | OPHTHALMIC | Status: AC | PRN
Start: 2022-12-21 — End: 2022-12-21
  Administered 2022-12-21 (×3): 1 [drp] via OPHTHALMIC

## 2022-12-21 MED ORDER — PHENYLEPHRINE HCL 2.5 % OP SOLN
1.0000 [drp] | OPHTHALMIC | Status: AC | PRN
Start: 2022-12-21 — End: 2022-12-21
  Administered 2022-12-21 (×3): 1 [drp] via OPHTHALMIC

## 2022-12-21 MED ORDER — SODIUM HYALURONATE 10 MG/ML IO SOLUTION
PREFILLED_SYRINGE | INTRAOCULAR | Status: DC | PRN
  Administered 2022-12-21: .85 mL via INTRAOCULAR

## 2022-12-21 SURGICAL SUPPLY — 13 items
CATARACT SUITE SIGHTPATH (MISCELLANEOUS) ×1
CLOTH BEACON ORANGE TIMEOUT ST (SAFETY) ×1 IMPLANT
EYE SHIELD UNIVERSAL CLEAR (GAUZE/BANDAGES/DRESSINGS) IMPLANT
FEE CATARACT SUITE SIGHTPATH (MISCELLANEOUS) ×1 IMPLANT
GLOVE BIOGEL PI IND STRL 7.0 (GLOVE) ×2 IMPLANT
LENS IOL TECNIS EYHANCE 18.5 (Intraocular Lens) IMPLANT
NDL HYPO 18GX1.5 BLUNT FILL (NEEDLE) ×1 IMPLANT
NEEDLE HYPO 18GX1.5 BLUNT FILL (NEEDLE) ×1
PAD ARMBOARD 7.5X6 YLW CONV (MISCELLANEOUS) ×1 IMPLANT
POSITIONER HEAD 8X9X4 ADT (SOFTGOODS) ×1 IMPLANT
SYR TB 1ML LL NO SAFETY (SYRINGE) ×1 IMPLANT
TAPE SURG TRANSPORE 1 IN (GAUZE/BANDAGES/DRESSINGS) IMPLANT
WATER STERILE IRR 250ML POUR (IV SOLUTION) ×1 IMPLANT

## 2022-12-21 NOTE — Interval H&P Note (Signed)
History and Physical Interval Note:  12/21/2022 9:11 AM  Mike Graham  has presented today for surgery, with the diagnosis of combined forms age related cataract, left eye.  The various methods of treatment have been discussed with the patient and family. After consideration of risks, benefits and other options for treatment, the patient has consented to  Procedure(s): CATARACT EXTRACTION PHACO AND INTRAOCULAR LENS PLACEMENT (IOC) (Left) as a surgical intervention.  The patient's history has been reviewed, patient examined, no change in status, stable for surgery.  I have reviewed the patient's chart and labs.  Questions were answered to the patient's satisfaction.     [name]

## 2022-12-21 NOTE — Op Note (Signed)
Date of procedure: 12/21/22  Pre-operative diagnosis: Visually significant age-related combined cataract, Left Eye (H25.812)  Post-operative diagnosis: Visually significant age-related combined cataract, Left Eye (H25.812)  Procedure: Removal of cataract via phacoemulsification and insertion of intra-ocular lens Laural Benes and  DIB00 +18.5D into the capsular bag of the Left Eye  Attending surgeon: Rudy Jew. Georgiana Spillane, MD, MA  Anesthesia: MAC, Topical Akten  Complications: None  Estimated Blood Loss: <41mL (minimal)  Specimens: None  Implants: As above  Indications:  Visually significant age-related cataract, Left Eye  Procedure:  The patient was seen and identified in the pre-operative area. The operative eye was identified and dilated.  The operative eye was marked.  Topical anesthesia was administered to the operative eye.     The patient was then to the operative suite and placed in the supine position.  A timeout was performed confirming the patient, procedure to be performed, and all other relevant information.   The patient's face was prepped and draped in the usual fashion for intra-ocular surgery.  A lid speculum was placed into the operative eye and the surgical microscope moved into place and focused.  An inferotemporal paracentesis was created using a 20 gauge paracentesis blade.  Shugarcaine was injected into the anterior chamber.  Viscoelastic was injected into the anterior chamber.  A temporal clear-corneal main wound incision was created using a 2.56mm microkeratome.  A continuous curvilinear capsulorrhexis was initiated using an irrigating cystitome and completed using capsulorrhexis forceps.  Hydrodissection and hydrodeliniation were performed.  Viscoelastic was injected into the anterior chamber.  A phacoemulsification handpiece and a chopper as a second instrument were used to remove the nucleus and epinucleus. The irrigation/aspiration handpiece was used to remove any  remaining cortical material.   The capsular bag was reinflated with viscoelastic, checked, and found to be intact.  The intraocular lens was inserted into the capsular bag.  The irrigation/aspiration handpiece was used to remove any remaining viscoelastic.  The clear corneal wound and paracentesis wounds were then hydrated and checked with Weck-Cels to be watertight. 0.45mL of Moxfloxacin was injected into the anterior chamber. The lid-speculum was removed.  The drape was removed.  The patient's face was cleaned with a wet and dry 4x4.    A clear shield was taped over the eye. The patient was taken to the post-operative care unit in good condition, having tolerated the procedure well.  Post-Op Instructions: The patient will follow up at [facility] for a same day post-operative evaluation and will receive all other orders and instructions.

## 2022-12-21 NOTE — Anesthesia Preprocedure Evaluation (Signed)
 Anesthesia Evaluation  Patient identified by MRN, date of birth, ID band Patient awake    Reviewed: Allergy & Precautions, H&P , NPO status , Patient's Chart, lab work & pertinent test results, reviewed documented beta blocker date and time   Airway Mallampati: II  TM Distance: >3 FB Neck ROM: full    Dental no notable dental hx. (+) Dental Advisory Given, Teeth Intact   Pulmonary neg pulmonary ROS, former smoker   Pulmonary exam normal breath sounds clear to auscultation       Cardiovascular Exercise Tolerance: Good hypertension, negative cardio ROS Normal cardiovascular exam Rhythm:regular Rate:Normal     Neuro/Psych negative neurological ROS  negative psych ROS   GI/Hepatic negative GI ROS, Neg liver ROS,GERD  ,,  Endo/Other  negative endocrine ROS    Renal/GU negative Renal ROS  negative genitourinary   Musculoskeletal  (+) Arthritis , Osteoarthritis,    Abdominal   Peds  Hematology negative hematology ROS (+)   Anesthesia Other Findings Prostate Cancer  Reproductive/Obstetrics negative OB ROS                             Anesthesia Physical Anesthesia Plan  ASA: 3  Anesthesia Plan: MAC   Post-op Pain Management: Minimal or no pain anticipated   Induction:   PONV Risk Score and Plan:   Airway Management Planned: Nasal Cannula and Natural Airway  Additional Equipment: None  Intra-op Plan:   Post-operative Plan:   Informed Consent: I have reviewed the patients History and Physical, chart, labs and discussed the procedure including the risks, benefits and alternatives for the proposed anesthesia with the patient or authorized representative who has indicated his/her understanding and acceptance.     Dental Advisory Given  Plan Discussed with: CRNA  Anesthesia Plan Comments:         Anesthesia Quick Evaluation

## 2022-12-21 NOTE — Transfer of Care (Signed)
Immediate Anesthesia Transfer of Care Note  Patient: Mike Graham  Procedure(s) Performed: CATARACT EXTRACTION PHACO AND INTRAOCULAR LENS PLACEMENT (IOC) (Left: Eye)  Patient Location: Short Stay  Anesthesia Type:MAC  Level of Consciousness: awake, alert , and oriented  Airway & Oxygen Therapy: Patient Spontanous Breathing  Post-op Assessment: Report given to RN and Post -op Vital signs reviewed and stable  Post vital signs: Reviewed and stable  Last Vitals:  Vitals Value Taken Time  BP    Temp    Pulse    Resp    SpO2      Last Pain:  Vitals:   12/21/22 0803  TempSrc: Oral  PainSc: 0-No pain      Patients Stated Pain Goal: 5 (12/21/22 0803)  Complications: No notable events documented.

## 2022-12-21 NOTE — Discharge Instructions (Signed)
Please discharge patient when stable, will follow up today with Dr. Carnella Fryman at the [facility] office immediately following discharge.  Leave shield in place until visit.  All paperwork with discharge instructions will be given at the office.  [facility] Address:  [address]  [place], [place] [address]  

## 2022-12-21 NOTE — Anesthesia Postprocedure Evaluation (Signed)
Anesthesia Post Note  Patient: Mike Graham  Procedure(s) Performed: CATARACT EXTRACTION PHACO AND INTRAOCULAR LENS PLACEMENT (IOC) (Left: Eye)  Patient location during evaluation: Phase II Anesthesia Type: MAC Level of consciousness: awake Pain management: pain level controlled Vital Signs Assessment: post-procedure vital signs reviewed and stable Respiratory status: spontaneous breathing and respiratory function stable Cardiovascular status: blood pressure returned to baseline and stable Postop Assessment: no headache and no apparent nausea or vomiting Anesthetic complications: no Comments: Late entry   No notable events documented.   Last Vitals:  Vitals:   12/21/22 0803 12/21/22 0925  BP: 130/69 123/81  Pulse: 65 65  Resp: 18 18  Temp: 36.8 C 36.7 C  SpO2: 98% 96%    Last Pain:  Vitals:   12/21/22 0925  TempSrc: Oral  PainSc: 0-No pain                 Windell Norfolk

## 2022-12-24 ENCOUNTER — Encounter (HOSPITAL_COMMUNITY): Payer: Self-pay | Admitting: Ophthalmology

## 2023-03-29 IMAGING — MR MR PROSTATE WO/W CM
12 series · 48 of 48 positions shown · IV contrast (multihance)
Comparison: None.

CLINICAL DATA: Prostate carcinoma, Gleason score 3+4=7.

EXAM:
MR PROSTATE WITHOUT AND WITH CONTRAST
TECHNIQUE: Multiplanar multisequence MRI images were obtained of the pelvis
centered about the prostate. Pre and post contrast images were
obtained.
CONTRAST:  19mL MULTIHANCE GADOBENATE DIMEGLUMINE 529 MG/ML IV SOLN

[Series 4: T2 · coronal · 3.0mm · 0.56mm/px · 1 of 25 slices shown (1 of 3)]
[im 1/25]
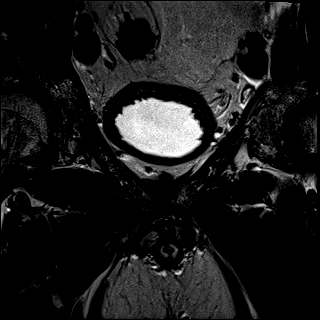

[Series 5: T1 · axial · 5.0mm · 1.25mm/px · 1 of 80 slices shown]
[im 1/80]
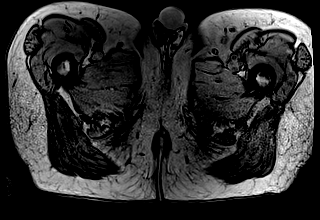

[Series 6: DWI · axial · 3.0mm · 1.75mm/px · z∈[-94,-37]mm · 2 of 54 slices shown (1 of 3)]
[im 1/54]
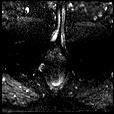
[im 54/54]
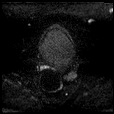

[Series 7: DWI · axial · 3.0mm · 1.75mm/px · 1 of 20 slices shown (2 of 3)]
[im 1/20]
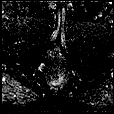

[Series 8: DWI · axial · 3.0mm · 1.75mm/px · 1 of 20 slices shown (3 of 3)]
[im 1/20]
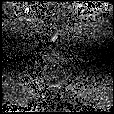

[Series 9: T2 · axial · 3.0mm · 0.56mm/px · 1 of 23 slices shown (2 of 3)]
[im 1/23]
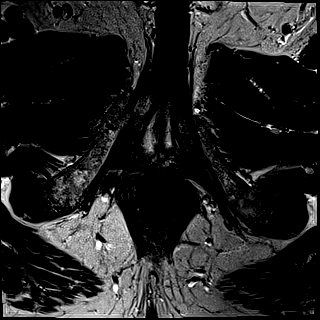

[Series 10: T2 · axial · 1.0mm · 1.04mm/px · z∈[-101,-30]mm · 2 of 72 slices shown (3 of 3)]
[im 1/72]
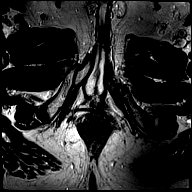
[im 72/72]
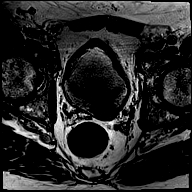

[Series 11: pre t1_twist_tra_dyn · axial · non-contrast · 3.5mm · 0.83mm/px · 1 of 20 slices shown]
[im 1/20]
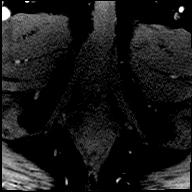

[Series 12: post t1_twist_tra_dyn-copy center · axial · non-contrast · 3.5mm · 0.83mm/px · z∈[-99,-33]mm · 17 of 600 slices shown]
[im 1/600]
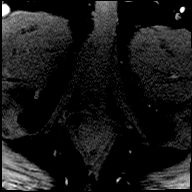
[im 38/600]
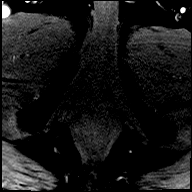
[im 75/600]
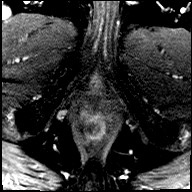
[im 113/600]
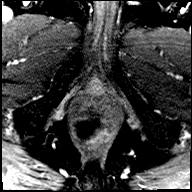
[im 150/600]
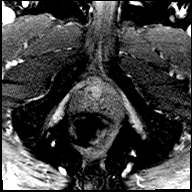
[im 188/600]
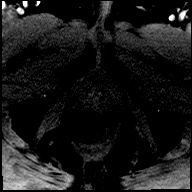
[im 225/600]
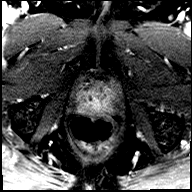
[im 263/600]
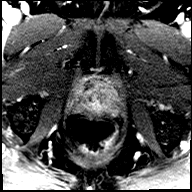
[im 300/600]
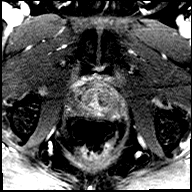
[im 337/600]
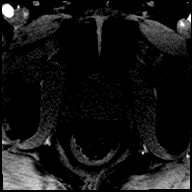
[im 375/600]
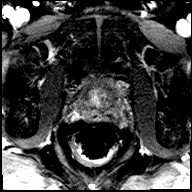
[im 412/600]
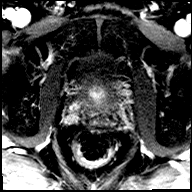
[im 450/600]
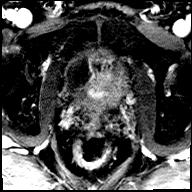
[im 487/600]
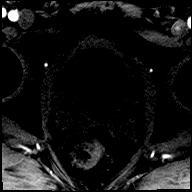
[im 525/600]
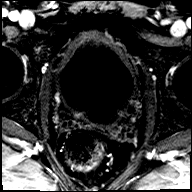
[im 562/600]
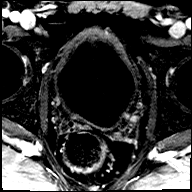
[im 600/600]
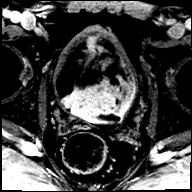

[Series 13: post t1_twist_tra_dyn-copy cent_sub · axial · 3.5mm · 0.83mm/px · z∈[-99,-33]mm · 17 of 578 slices shown]
[im 1/578]
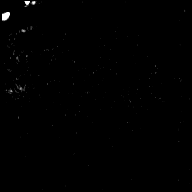
[im 37/578]
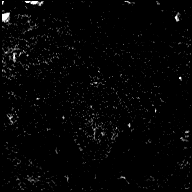
[im 73/578]
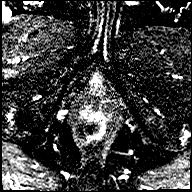
[im 109/578]
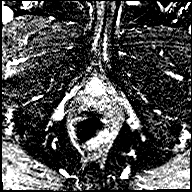
[im 145/578]
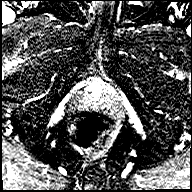
[im 181/578]
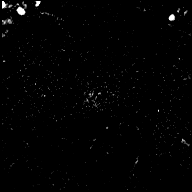
[im 217/578]
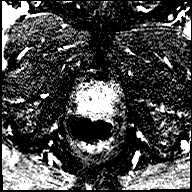
[im 253/578]
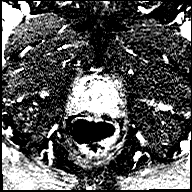
[im 289/578]
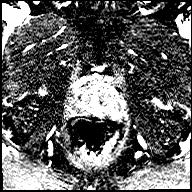
[im 325/578]
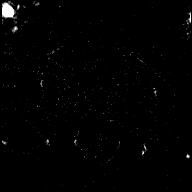
[im 361/578]
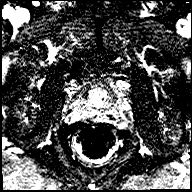
[im 397/578]
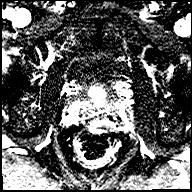
[im 433/578]
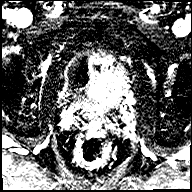
[im 469/578]
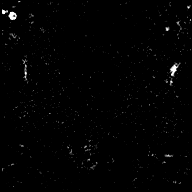
[im 505/578]
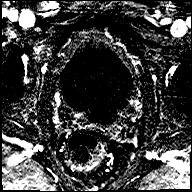
[im 541/578]
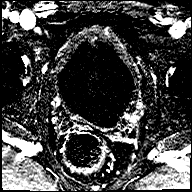
[im 578/578]
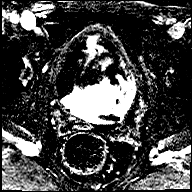

[Series 14: t1_vibe_dixon_tra_f · axial · 2.5mm · 0.91mm/px · z∈[-116,+82]mm · 2 of 80 slices shown]
[im 1/80]
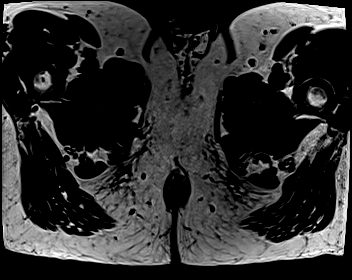
[im 80/80]
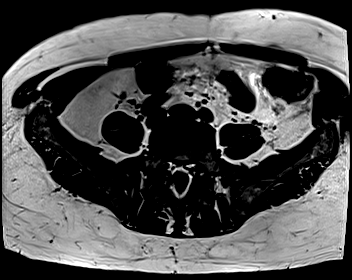

[Series 15: t1_vibe_dixon_tra_w · axial · 2.5mm · 0.91mm/px · z∈[-116,+82]mm · 2 of 80 slices shown]
[im 1/80]
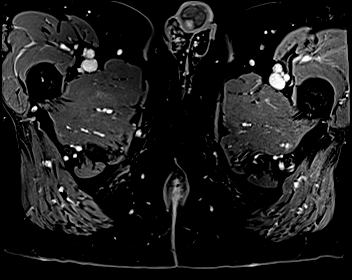
[im 80/80]
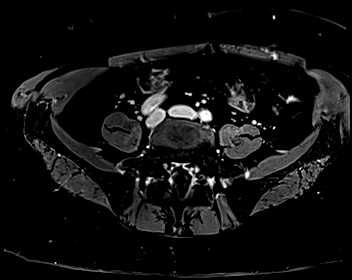

[48 of 48 positions shown; findings below may reference images not displayed]

FINDINGS: Prostate:

-- Peripheral Zone: No focal lesion seen on ADC or high b-value DWI
sequences.

-- Transition/Central Zone: Mildly enlarged with several small
encapsulated BPH nodules noted; however, no suspicious nodules with
obscured margins or significantly restricted diffusion seen.

-- Measurements/Volume:  3.3 by 3.0 x 4.7 cm (volume = 24 cm^3)

Transcapsular spread:  Absent

Seminal vesicle involvement:  Absent

Neurovascular bundle involvement:  Absent

Pelvic adenopathy: None visualized

Bone metastasis: None visualized

Other: Sigmoid diverticulosis noted, without evidence of
diverticulitis.
IMPRESSION: No radiographic evidence of high-grade prostate carcinoma. PI-RADS 1
(v2.1): Very Low (clinically significant cancer highly unlikely)
